# Patient Record
Sex: Male | Born: 1945 | Race: White | Hispanic: No | Smoking: Never smoker
Health system: Southern US, Community
[De-identification: ages and names within clinical notes are randomized; demographics above are authoritative.]

## PROBLEM LIST (undated history)

## (undated) DIAGNOSIS — R0609 Other forms of dyspnea: Secondary | ICD-10-CM

## (undated) DIAGNOSIS — I10 Essential (primary) hypertension: Secondary | ICD-10-CM

## (undated) DIAGNOSIS — I471 Supraventricular tachycardia, unspecified: Secondary | ICD-10-CM

## (undated) DIAGNOSIS — G4700 Insomnia, unspecified: Secondary | ICD-10-CM

## (undated) DIAGNOSIS — Z950 Presence of cardiac pacemaker: Secondary | ICD-10-CM

## (undated) DIAGNOSIS — G8929 Other chronic pain: Secondary | ICD-10-CM

## (undated) DIAGNOSIS — N2889 Other specified disorders of kidney and ureter: Secondary | ICD-10-CM

## (undated) DIAGNOSIS — I619 Nontraumatic intracerebral hemorrhage, unspecified: Secondary | ICD-10-CM

## (undated) DIAGNOSIS — Z8701 Personal history of pneumonia (recurrent): Secondary | ICD-10-CM

## (undated) DIAGNOSIS — M545 Low back pain, unspecified: Secondary | ICD-10-CM

## (undated) DIAGNOSIS — G40909 Epilepsy, unspecified, not intractable, without status epilepticus: Secondary | ICD-10-CM

## (undated) DIAGNOSIS — Z942 Lung transplant status: Secondary | ICD-10-CM

## (undated) DIAGNOSIS — Z9989 Dependence on other enabling machines and devices: Secondary | ICD-10-CM

## (undated) DIAGNOSIS — Z8719 Personal history of other diseases of the digestive system: Secondary | ICD-10-CM

## (undated) DIAGNOSIS — M47816 Spondylosis without myelopathy or radiculopathy, lumbar region: Secondary | ICD-10-CM

## (undated) DIAGNOSIS — K5790 Diverticulosis of intestine, part unspecified, without perforation or abscess without bleeding: Secondary | ICD-10-CM

## (undated) DIAGNOSIS — G4733 Obstructive sleep apnea (adult) (pediatric): Secondary | ICD-10-CM

## (undated) DIAGNOSIS — R296 Repeated falls: Secondary | ICD-10-CM

## (undated) DIAGNOSIS — N182 Chronic kidney disease, stage 2 (mild): Secondary | ICD-10-CM

## (undated) DIAGNOSIS — F329 Major depressive disorder, single episode, unspecified: Secondary | ICD-10-CM

## (undated) DIAGNOSIS — G43909 Migraine, unspecified, not intractable, without status migrainosus: Secondary | ICD-10-CM

## (undated) DIAGNOSIS — D649 Anemia, unspecified: Secondary | ICD-10-CM

## (undated) DIAGNOSIS — I480 Paroxysmal atrial fibrillation: Secondary | ICD-10-CM

## (undated) DIAGNOSIS — E785 Hyperlipidemia, unspecified: Secondary | ICD-10-CM

## (undated) DIAGNOSIS — F32A Depression, unspecified: Secondary | ICD-10-CM

## (undated) DIAGNOSIS — R7301 Impaired fasting glucose: Secondary | ICD-10-CM

## (undated) DIAGNOSIS — K922 Gastrointestinal hemorrhage, unspecified: Secondary | ICD-10-CM

## (undated) DIAGNOSIS — R0600 Dyspnea, unspecified: Secondary | ICD-10-CM

## (undated) DIAGNOSIS — N200 Calculus of kidney: Secondary | ICD-10-CM

## (undated) DIAGNOSIS — I495 Sick sinus syndrome: Secondary | ICD-10-CM

## (undated) HISTORY — DX: Sick sinus syndrome: I49.5

## (undated) HISTORY — DX: Essential (primary) hypertension: I10

## (undated) HISTORY — DX: Paroxysmal atrial fibrillation: I48.0

## (undated) HISTORY — DX: Obstructive sleep apnea (adult) (pediatric): G47.33

## (undated) HISTORY — DX: Lung transplant status: Z94.2

## (undated) HISTORY — DX: Insomnia, unspecified: G47.00

## (undated) HISTORY — DX: Dyspnea, unspecified: R06.00

## (undated) HISTORY — DX: Presence of cardiac pacemaker: Z95.0

## (undated) HISTORY — DX: Repeated falls: R29.6

## (undated) HISTORY — DX: Major depressive disorder, single episode, unspecified: F32.9

## (undated) HISTORY — PX: APPENDECTOMY: SHX54

## (undated) HISTORY — DX: Other forms of dyspnea: R06.09

## (undated) HISTORY — DX: Nontraumatic intracerebral hemorrhage, unspecified: I61.9

## (undated) HISTORY — DX: Hyperlipidemia, unspecified: E78.5

## (undated) HISTORY — DX: Low back pain, unspecified: M54.50

## (undated) HISTORY — DX: Other specified disorders of kidney and ureter: N28.89

## (undated) HISTORY — DX: Impaired fasting glucose: R73.01

## (undated) HISTORY — DX: Calculus of kidney: N20.0

## (undated) HISTORY — DX: Migraine, unspecified, not intractable, without status migrainosus: G43.909

## (undated) HISTORY — DX: Epilepsy, unspecified, not intractable, without status epilepticus: G40.909

## (undated) HISTORY — DX: Gastrointestinal hemorrhage, unspecified: K92.2

## (undated) HISTORY — DX: Low back pain: M54.5

## (undated) HISTORY — DX: Other chronic pain: G89.29

## (undated) HISTORY — DX: Dependence on other enabling machines and devices: Z99.89

## (undated) HISTORY — DX: Depression, unspecified: F32.A

## (undated) HISTORY — DX: Personal history of other diseases of the digestive system: Z87.19

## (undated) HISTORY — DX: Anemia, unspecified: D64.9

## (undated) HISTORY — DX: Personal history of pneumonia (recurrent): Z87.01

## (undated) HISTORY — DX: Spondylosis without myelopathy or radiculopathy, lumbar region: M47.816

## (undated) HISTORY — DX: Diverticulosis of intestine, part unspecified, without perforation or abscess without bleeding: K57.90

## (undated) HISTORY — DX: Chronic kidney disease, stage 2 (mild): N18.2

---

## 1997-07-03 ENCOUNTER — Emergency Department (HOSPITAL_COMMUNITY): Admission: EM | Admit: 1997-07-03 | Discharge: 1997-07-03 | Payer: Self-pay | Admitting: Emergency Medicine

## 2000-01-09 HISTORY — PX: SPLENECTOMY, TOTAL: SHX788

## 2010-01-08 HISTORY — PX: LUNG TRANSPLANT, DOUBLE: SHX704

## 2013-08-22 ENCOUNTER — Encounter (HOSPITAL_COMMUNITY): Payer: Self-pay | Admitting: Emergency Medicine

## 2013-08-22 ENCOUNTER — Emergency Department (HOSPITAL_COMMUNITY)
Admission: EM | Admit: 2013-08-22 | Discharge: 2013-08-22 | Disposition: A | Discharge status: Home / Self Care | Payer: Medicare Other | Attending: Emergency Medicine | Admitting: Emergency Medicine

## 2013-08-22 ENCOUNTER — Emergency Department (HOSPITAL_COMMUNITY): Payer: Medicare Other

## 2013-08-22 DIAGNOSIS — Z79899 Other long term (current) drug therapy: Secondary | ICD-10-CM | POA: Diagnosis not present

## 2013-08-22 DIAGNOSIS — IMO0002 Reserved for concepts with insufficient information to code with codable children: Secondary | ICD-10-CM | POA: Diagnosis not present

## 2013-08-22 DIAGNOSIS — Z7982 Long term (current) use of aspirin: Secondary | ICD-10-CM | POA: Diagnosis not present

## 2013-08-22 DIAGNOSIS — I498 Other specified cardiac arrhythmias: Secondary | ICD-10-CM | POA: Diagnosis not present

## 2013-08-22 DIAGNOSIS — R000 Tachycardia, unspecified: Secondary | ICD-10-CM | POA: Diagnosis present

## 2013-08-22 DIAGNOSIS — D649 Anemia, unspecified: Secondary | ICD-10-CM | POA: Diagnosis not present

## 2013-08-22 DIAGNOSIS — I471 Supraventricular tachycardia: Secondary | ICD-10-CM

## 2013-08-22 HISTORY — DX: Supraventricular tachycardia, unspecified: I47.10

## 2013-08-22 HISTORY — DX: Supraventricular tachycardia: I47.1

## 2013-08-22 LAB — COMPREHENSIVE METABOLIC PANEL
ALBUMIN: 3.1 g/dL — AB (ref 3.5–5.2)
ALT: 22 U/L (ref 0–53)
ANION GAP: 13 (ref 5–15)
AST: 19 U/L (ref 0–37)
Alkaline Phosphatase: 102 U/L (ref 39–117)
BUN: 33 mg/dL — ABNORMAL HIGH (ref 6–23)
CHLORIDE: 103 meq/L (ref 96–112)
CO2: 25 mEq/L (ref 19–32)
Calcium: 8.9 mg/dL (ref 8.4–10.5)
Creatinine, Ser: 1.22 mg/dL (ref 0.50–1.35)
GFR calc Af Amer: 69 mL/min — ABNORMAL LOW (ref >90)
GFR calc non Af Amer: 60 mL/min — ABNORMAL LOW (ref >90)
Glucose, Bld: 158 mg/dL — ABNORMAL HIGH (ref 70–99)
POTASSIUM: 4.9 meq/L (ref 3.7–5.3)
SODIUM: 141 meq/L (ref 137–147)
TOTAL PROTEIN: 6.3 g/dL (ref 6.0–8.3)

## 2013-08-22 LAB — CBC WITH DIFFERENTIAL/PLATELET
BASOS ABS: 0 10*3/uL (ref 0.0–0.1)
Basophils Relative: 0 % (ref 0–1)
Eosinophils Absolute: 0.2 10*3/uL (ref 0.0–0.7)
Eosinophils Relative: 3 % (ref 0–5)
HCT: 36.2 % — ABNORMAL LOW (ref 39.0–52.0)
Hemoglobin: 12.2 g/dL — ABNORMAL LOW (ref 13.0–17.0)
LYMPHS ABS: 1.3 10*3/uL (ref 0.7–4.0)
LYMPHS PCT: 19 % (ref 12–46)
MCH: 31 pg (ref 26.0–34.0)
MCHC: 33.7 g/dL (ref 30.0–36.0)
MCV: 91.9 fL (ref 78.0–100.0)
Monocytes Absolute: 1.1 10*3/uL — ABNORMAL HIGH (ref 0.1–1.0)
Monocytes Relative: 16 % — ABNORMAL HIGH (ref 3–12)
NEUTROS ABS: 4.4 10*3/uL (ref 1.7–7.7)
NEUTROS PCT: 62 % (ref 43–77)
PLATELETS: 202 10*3/uL (ref 150–400)
RBC: 3.94 MIL/uL — ABNORMAL LOW (ref 4.22–5.81)
RDW: 13.2 % (ref 11.5–15.5)
WBC: 7.1 10*3/uL (ref 4.0–10.5)

## 2013-08-22 LAB — I-STAT TROPONIN, ED: TROPONIN I, POC: 0 ng/mL (ref 0.00–0.08)
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
## 2013-08-22 NOTE — ED Notes (Signed)
Per EMS, pt was unloading dishes from a dishwasher when he began having feeling dizzy and became clammy and pale. Pt had initial rhythm that showed SVT with a rate of 180. EMS attempted vagal maneuvers with no success and gave 6 of adenosine which converted the pt in sinus tach. Pt reports nausea at this time.  BP; 130/90 HR: 106

## 2014-03-09 DIAGNOSIS — I619 Nontraumatic intracerebral hemorrhage, unspecified: Secondary | ICD-10-CM

## 2014-03-09 HISTORY — DX: Nontraumatic intracerebral hemorrhage, unspecified: I61.9

## 2014-09-27 HISTORY — PX: COLONOSCOPY: SHX174

## 2014-11-06 LAB — LIPID PANEL
Cholesterol: 171 (ref 0–200)
HDL: 59 (ref 35–70)
LDL Cholesterol: 93
Triglycerides: 97 (ref 40–160)

## 2014-11-06 LAB — HEMOGLOBIN A1C: HEMOGLOBIN A1C: 5.8

## 2015-03-30 IMAGING — CR DG CHEST 2V
2 series · 2 of 2 positions shown · non-contrast
Comparison: None.

CLINICAL DATA: Dizziness, tachycardia.

EXAM:
CHEST  2 VIEW

[w chest pa]
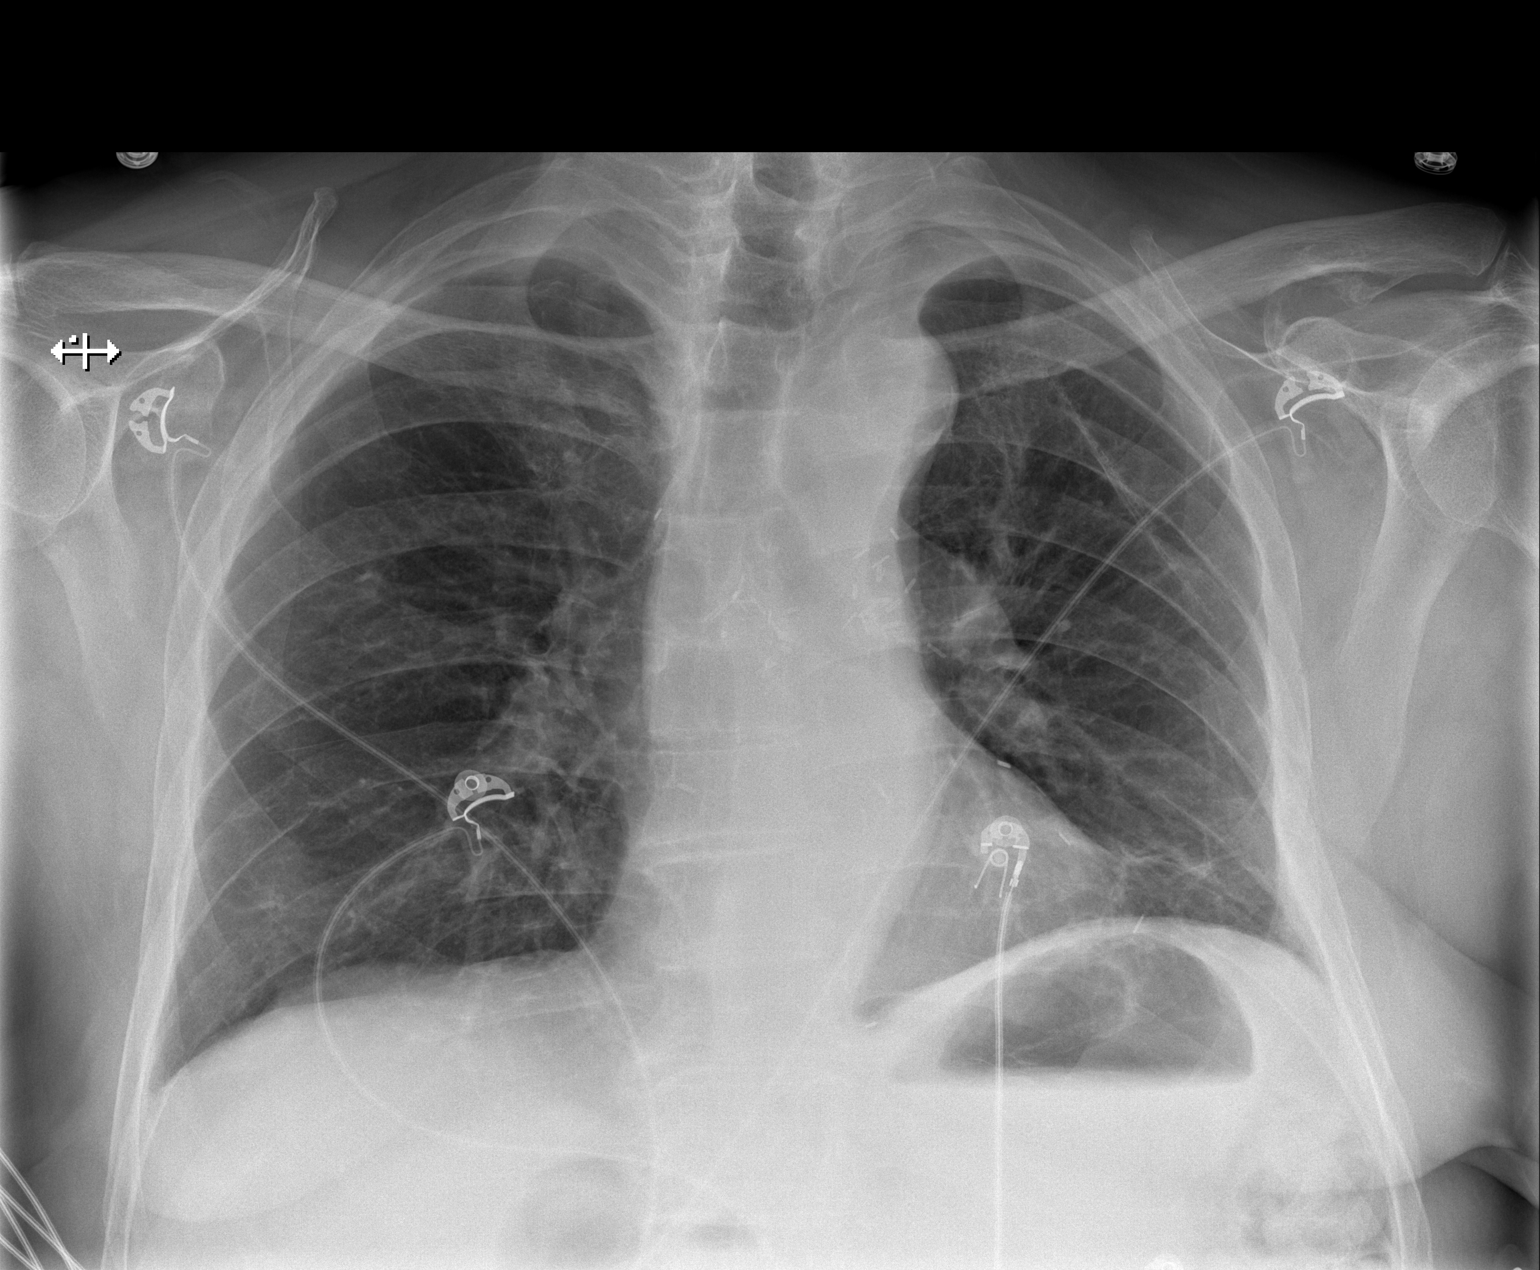

[w chest lat]
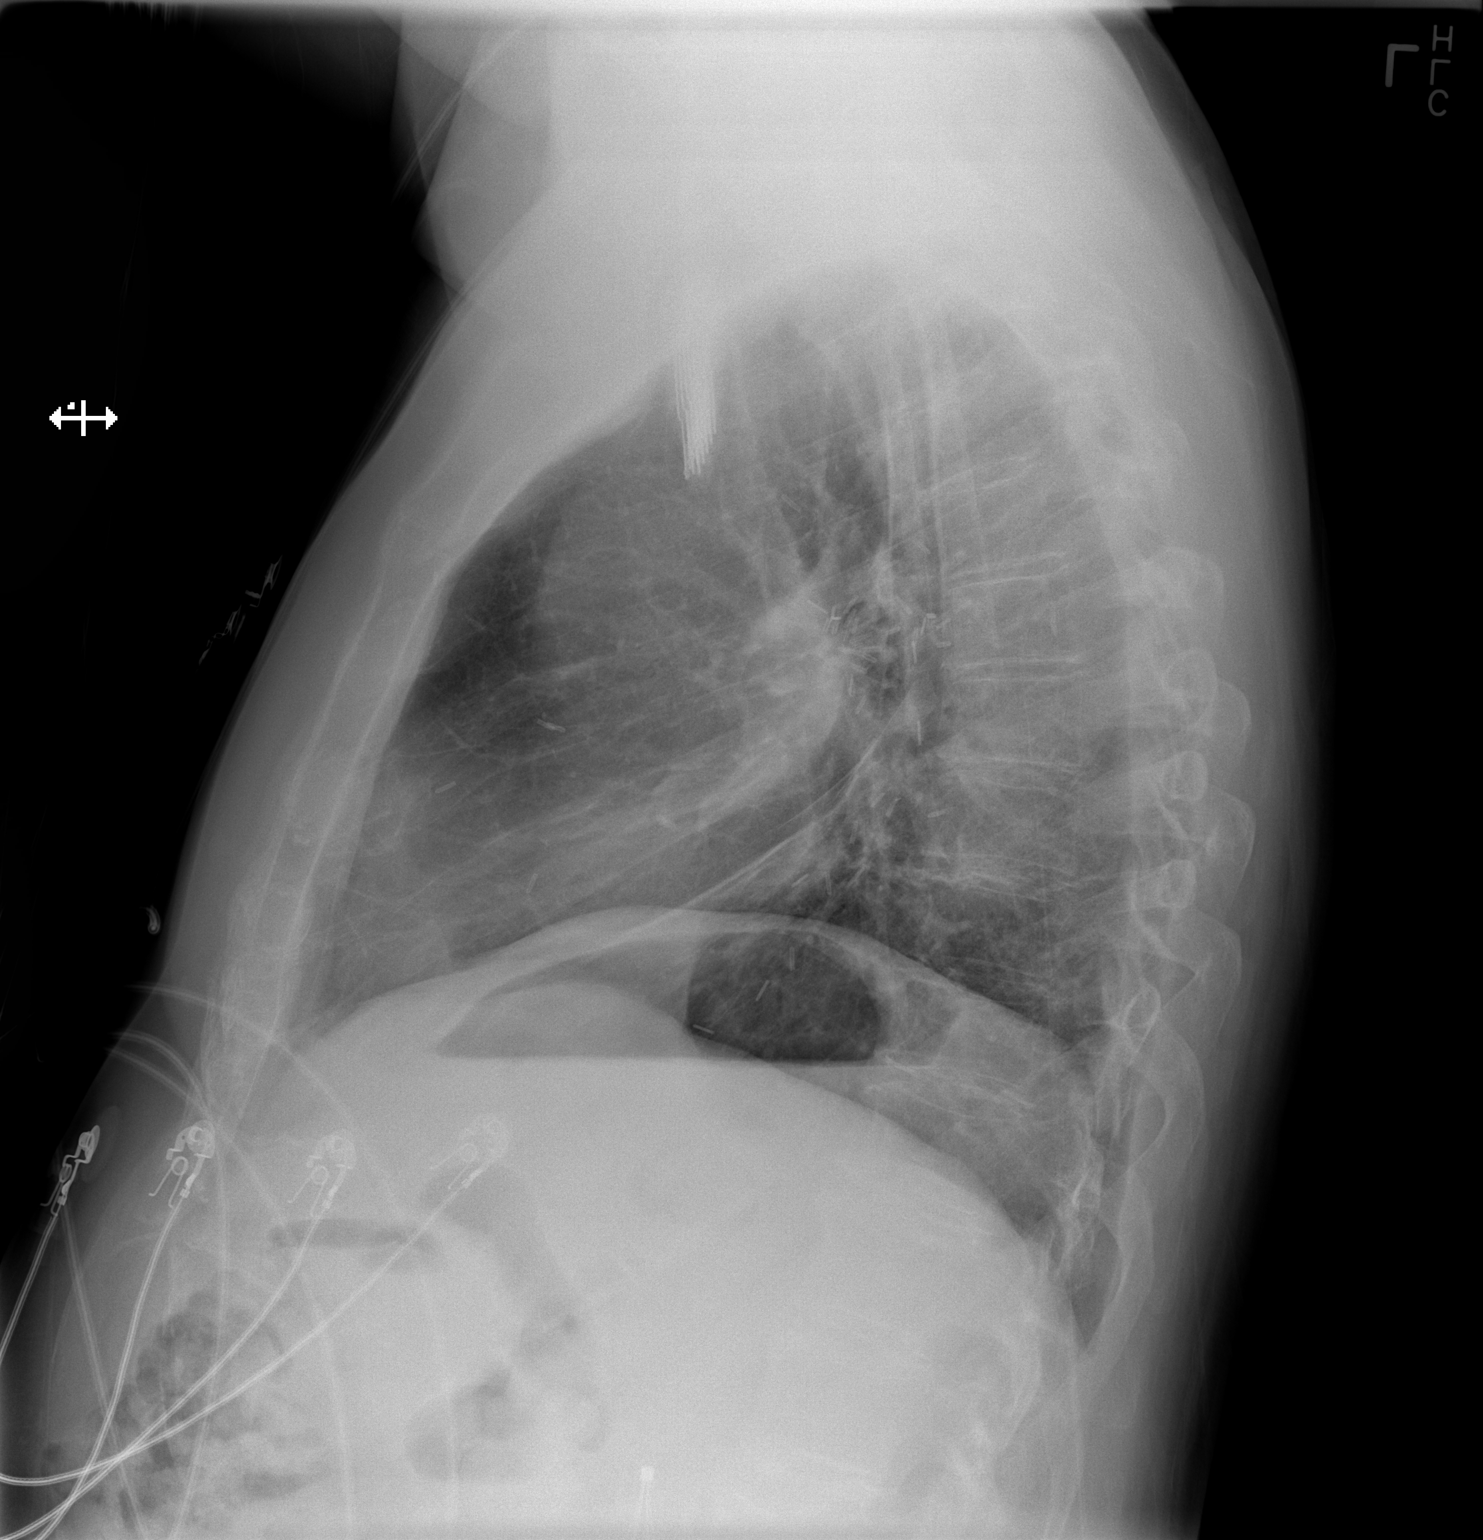

[2 of 2 positions shown; findings below may reference images not displayed]

FINDINGS: Cardiac silhouette appears normal, mediastinal silhouette is
nonsuspicious, mildly calcified aortic knob. Surgical clips within
the anterior and middle mediastinum. Scarring at left lung base. No
pleural effusions or focal consolidations. No pneumothorax.

Partially imaged inferior vena cava filter at the level of L2-3.
Soft tissue planes and included osseous structures are
nonsuspicious. Mild degenerative change of the thoracic spine.
Possible left thoracotomy.
IMPRESSION: No acute cardiopulmonary process.

Postsurgical changes within the mediastinum with scarring left lung
base.

  By: Reisha Aduju

## 2015-06-24 HISTORY — PX: PACEMAKER PLACEMENT: SHX43

## 2015-06-24 LAB — POCT INR: INR: 1.2 — AB (ref <=1.1)

## 2015-09-02 HISTORY — PX: TRANSTHORACIC ECHOCARDIOGRAM: SHX275

## 2016-05-11 LAB — HEPATIC FUNCTION PANEL
ALT: 28 (ref 10–40)
AST: 33 (ref 14–40)

## 2016-05-11 LAB — BASIC METABOLIC PANEL
BUN: 30 — AB (ref 4–21)
Creatinine: 1.2 (ref <=1.3)
GLUCOSE: 108
Potassium: 4.7 (ref 3.4–5.3)
SODIUM: 139 (ref 137–147)

## 2016-05-11 LAB — CBC AND DIFFERENTIAL
HCT: 37 — AB (ref 41–53)
Hemoglobin: 12.4 — AB (ref 13.5–17.5)
Platelets: 239 (ref 150–399)
WBC: 4.5

## 2016-07-27 ENCOUNTER — Encounter: Payer: Self-pay | Admitting: *Deleted

## 2016-07-27 ENCOUNTER — Telehealth: Payer: Self-pay | Admitting: *Deleted

## 2016-07-27 NOTE — Telephone Encounter (Signed)
Pt has a New Pt apt on 08/22/16.  Received complete medical record from [facility].  I have reviewed and abstracted these records.   Records have been put on Dr. Verdis FredericksonMcGowens desk for review.

## 2016-08-08 DIAGNOSIS — D649 Anemia, unspecified: Secondary | ICD-10-CM

## 2016-08-08 HISTORY — DX: Anemia, unspecified: D64.9

## 2016-08-19 ENCOUNTER — Encounter: Payer: Self-pay | Admitting: Family Medicine

## 2016-08-22 ENCOUNTER — Ambulatory Visit (INDEPENDENT_AMBULATORY_CARE_PROVIDER_SITE_OTHER): Payer: Medicare Other | Admitting: Family Medicine

## 2016-08-22 ENCOUNTER — Encounter: Payer: Self-pay | Admitting: Family Medicine

## 2016-08-22 VITALS — BP 132/77 | HR 70 | Temp 98.1°F | Resp 16 | Ht 71.0 in | Wt 223.0 lb

## 2016-08-22 DIAGNOSIS — Z79899 Other long term (current) drug therapy: Secondary | ICD-10-CM

## 2016-08-22 DIAGNOSIS — I480 Paroxysmal atrial fibrillation: Secondary | ICD-10-CM

## 2016-08-22 DIAGNOSIS — M545 Low back pain: Secondary | ICD-10-CM

## 2016-08-22 DIAGNOSIS — G8929 Other chronic pain: Secondary | ICD-10-CM | POA: Diagnosis not present

## 2016-08-22 DIAGNOSIS — N183 Chronic kidney disease, stage 3 unspecified: Secondary | ICD-10-CM

## 2016-08-22 DIAGNOSIS — M5136 Other intervertebral disc degeneration, lumbar region: Secondary | ICD-10-CM | POA: Diagnosis not present

## 2016-08-22 DIAGNOSIS — Z942 Lung transplant status: Secondary | ICD-10-CM | POA: Diagnosis not present

## 2016-08-22 DIAGNOSIS — G894 Chronic pain syndrome: Secondary | ICD-10-CM | POA: Diagnosis not present

## 2016-08-22 DIAGNOSIS — I495 Sick sinus syndrome: Secondary | ICD-10-CM | POA: Diagnosis not present

## 2016-08-22 DIAGNOSIS — Z8673 Personal history of transient ischemic attack (TIA), and cerebral infarction without residual deficits: Secondary | ICD-10-CM

## 2016-08-22 DIAGNOSIS — I10 Essential (primary) hypertension: Secondary | ICD-10-CM

## 2016-08-22 MED ORDER — OXYCODONE-ACETAMINOPHEN 7.5-325 MG PO TABS: 1.0000 | ORAL_TABLET | ORAL | 0 refills | Status: DC | PRN
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
## 2016-08-24 ENCOUNTER — Other Ambulatory Visit (INDEPENDENT_AMBULATORY_CARE_PROVIDER_SITE_OTHER): Payer: Medicare Other

## 2016-08-24 DIAGNOSIS — Z942 Lung transplant status: Secondary | ICD-10-CM

## 2016-08-24 DIAGNOSIS — Z79899 Other long term (current) drug therapy: Secondary | ICD-10-CM

## 2016-08-24 DIAGNOSIS — N183 Chronic kidney disease, stage 3 unspecified: Secondary | ICD-10-CM

## 2016-08-25 LAB — TACROLIMUS LEVEL: TACROLIMUS LVL: 9.1 ng/mL (ref 5.0–20.0)

## 2016-08-28 NOTE — Addendum Note (Signed)
Addended by: Eulah Pont on: 08/28/2016 10:07 AM   Modules accepted: Orders

## 2016-08-29 ENCOUNTER — Other Ambulatory Visit (INDEPENDENT_AMBULATORY_CARE_PROVIDER_SITE_OTHER): Payer: Medicare Other

## 2016-08-29 DIAGNOSIS — N183 Chronic kidney disease, stage 3 unspecified: Secondary | ICD-10-CM

## 2016-08-29 DIAGNOSIS — Z942 Lung transplant status: Secondary | ICD-10-CM

## 2016-08-29 DIAGNOSIS — Z79899 Other long term (current) drug therapy: Secondary | ICD-10-CM | POA: Diagnosis not present

## 2016-08-29 LAB — CBC WITH DIFFERENTIAL/PLATELET
BASOS ABS: 0.1 10*3/uL (ref 0.0–0.1)
Basophils Relative: 1.7 % (ref 0.0–3.0)
Eosinophils Absolute: 0.1 10*3/uL (ref 0.0–0.7)
Eosinophils Relative: 2.4 % (ref 0.0–5.0)
HCT: 35 % — ABNORMAL LOW (ref 39.0–52.0)
Hemoglobin: 11.5 g/dL — ABNORMAL LOW (ref 13.0–17.0)
LYMPHS ABS: 1.3 10*3/uL (ref 0.7–4.0)
Lymphocytes Relative: 28.7 % (ref 12.0–46.0)
MCHC: 32.8 g/dL (ref 30.0–36.0)
MCV: 95.1 fl (ref 78.0–100.0)
MONOS PCT: 12.7 % — AB (ref 3.0–12.0)
Monocytes Absolute: 0.6 10*3/uL (ref 0.1–1.0)
NEUTROS ABS: 2.5 10*3/uL (ref 1.4–7.7)
NEUTROS PCT: 54.5 % (ref 43.0–77.0)
PLATELETS: 211 10*3/uL (ref 150.0–400.0)
RBC: 3.67 Mil/uL — ABNORMAL LOW (ref 4.22–5.81)
RDW: 16.2 % — ABNORMAL HIGH (ref 11.5–15.5)
WBC: 4.5 10*3/uL (ref 4.0–10.5)

## 2016-08-29 LAB — COMPREHENSIVE METABOLIC PANEL
ALT: 12 U/L (ref 0–53)
AST: 16 U/L (ref 0–37)
Albumin: 3.8 g/dL (ref 3.5–5.2)
Alkaline Phosphatase: 91 U/L (ref 39–117)
BILIRUBIN TOTAL: 0.3 mg/dL (ref 0.2–1.2)
BUN: 29 mg/dL — ABNORMAL HIGH (ref 6–23)
CO2: 30 meq/L (ref 19–32)
Calcium: 8.6 mg/dL (ref 8.4–10.5)
Chloride: 104 mEq/L (ref 96–112)
Creatinine, Ser: 1.33 mg/dL (ref 0.40–1.50)
GFR: 56.38 mL/min — AB (ref >60.00)
GLUCOSE: 145 mg/dL — AB (ref 70–99)
POTASSIUM: 4.7 meq/L (ref 3.5–5.1)
Sodium: 139 mEq/L (ref 135–145)
Total Protein: 6.1 g/dL (ref 6.0–8.3)

## 2016-08-29 NOTE — Progress Notes (Signed)
Patient came into office today for repeat lab work, due to lab error last time.  NO charge should be placed for venipuncture and collection.

## 2016-08-30 ENCOUNTER — Encounter: Payer: Self-pay | Admitting: Family Medicine

## 2016-08-30 ENCOUNTER — Other Ambulatory Visit (INDEPENDENT_AMBULATORY_CARE_PROVIDER_SITE_OTHER): Payer: Medicare Other

## 2016-08-30 DIAGNOSIS — D500 Iron deficiency anemia secondary to blood loss (chronic): Secondary | ICD-10-CM

## 2016-08-30 DIAGNOSIS — R739 Hyperglycemia, unspecified: Secondary | ICD-10-CM | POA: Diagnosis not present

## 2016-08-30 LAB — IBC PANEL
IRON: 117 ug/dL (ref 42–165)
SATURATION RATIOS: 34 % (ref 20.0–50.0)
Transferrin: 246 mg/dL (ref 212.0–360.0)

## 2016-08-30 LAB — FERRITIN: Ferritin: 41.9 ng/mL (ref 22.0–322.0)

## 2016-08-30 LAB — VITAMIN B12: VITAMIN B 12: 580 pg/mL (ref 211–911)

## 2016-08-30 LAB — HEMOGLOBIN A1C: Hgb A1c MFr Bld: 5.9 % (ref 4.6–6.5)

## 2016-09-06 ENCOUNTER — Other Ambulatory Visit: Payer: Medicare Other

## 2016-09-06 ENCOUNTER — Encounter: Payer: Self-pay | Admitting: Family Medicine

## 2016-09-06 ENCOUNTER — Other Ambulatory Visit: Payer: Self-pay | Admitting: Family Medicine

## 2016-09-06 DIAGNOSIS — D508 Other iron deficiency anemias: Secondary | ICD-10-CM

## 2016-09-06 LAB — HEMOCCULT SLIDES (X 3 CARDS)
FECAL OCCULT BLD: NEGATIVE
OCCULT 1: NEGATIVE
OCCULT 2: NEGATIVE
OCCULT 3: NEGATIVE
OCCULT 4: NEGATIVE
OCCULT 5: NEGATIVE

## 2016-09-19 ENCOUNTER — Encounter: Payer: Self-pay | Admitting: Family Medicine

## 2016-09-19 ENCOUNTER — Ambulatory Visit (INDEPENDENT_AMBULATORY_CARE_PROVIDER_SITE_OTHER): Payer: Medicare Other | Admitting: Family Medicine

## 2016-09-19 VITALS — BP 105/66 | HR 65 | Temp 97.9°F | Resp 16 | Ht 71.0 in | Wt 220.8 lb

## 2016-09-19 DIAGNOSIS — N183 Chronic kidney disease, stage 3 unspecified: Secondary | ICD-10-CM

## 2016-09-19 DIAGNOSIS — I10 Essential (primary) hypertension: Secondary | ICD-10-CM | POA: Diagnosis not present

## 2016-09-19 DIAGNOSIS — G894 Chronic pain syndrome: Secondary | ICD-10-CM

## 2016-09-19 DIAGNOSIS — Z23 Encounter for immunization: Secondary | ICD-10-CM

## 2016-09-19 DIAGNOSIS — Z8709 Personal history of other diseases of the respiratory system: Secondary | ICD-10-CM | POA: Diagnosis not present

## 2016-09-19 DIAGNOSIS — Z942 Lung transplant status: Secondary | ICD-10-CM | POA: Diagnosis not present

## 2016-09-19 MED ORDER — CLONAZEPAM 0.5 MG PO TABS: 0.5000 mg | ORAL_TABLET | Freq: Two times a day (BID) | ORAL | 4 refills | Status: AC

## 2016-09-19 MED ORDER — OXYCODONE-ACETAMINOPHEN 7.5-325 MG PO TABS: ORAL_TABLET | ORAL | 0 refills | Status: AC
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
## 2016-09-24 ENCOUNTER — Other Ambulatory Visit: Payer: Self-pay | Admitting: *Deleted

## 2016-09-24 MED ORDER — FERROUS SULFATE 325 (65 FE) MG PO TABS: 325.0000 mg | ORAL_TABLET | Freq: Every day | ORAL | 3 refills | Status: AC

## 2016-09-24 MED ORDER — METOPROLOL SUCCINATE ER 25 MG PO TB24: 12.5000 mg | ORAL_TABLET | Freq: Every day | ORAL | 3 refills | Status: DC

## 2016-09-24 NOTE — Telephone Encounter (Signed)
New pt. Please advise. Thanks.  

## 2016-09-24 NOTE — Telephone Encounter (Signed)
Brenda advised

## 2016-09-25 ENCOUNTER — Telehealth: Payer: Self-pay | Admitting: Family Medicine

## 2016-09-25 MED ORDER — GABAPENTIN 400 MG PO CAPS: 800.0000 mg | ORAL_CAPSULE | Freq: Two times a day (BID) | ORAL | 1 refills | Status: DC

## 2016-09-25 NOTE — Telephone Encounter (Signed)
Pt notified refill sent to pharmacy as requested.

## 2016-09-25 NOTE — Telephone Encounter (Signed)
Gabapentin RF'd. 

## 2016-09-25 NOTE — Telephone Encounter (Signed)
Requesting refill of gabapentin (NEURONTIN) 400 MG capsule.  Pharmacy:  [id number] - [place], [place] - [phone] (Phone) [phone] (Fax)

## 2016-09-26 ENCOUNTER — Encounter: Payer: Self-pay | Admitting: Family Medicine

## 2016-10-12 ENCOUNTER — Institutional Professional Consult (permissible substitution): Payer: Medicare Other | Admitting: Pulmonary Disease

## 2016-10-17 ENCOUNTER — Other Ambulatory Visit: Payer: Self-pay | Admitting: *Deleted

## 2016-10-17 NOTE — Telephone Encounter (Signed)
Fax from pharmacy requesting 90 day supply, qty: 360. Please advise. Thanks.

## 2016-10-18 MED ORDER — GABAPENTIN 400 MG PO CAPS: 800.0000 mg | ORAL_CAPSULE | Freq: Two times a day (BID) | ORAL | 0 refills | Status: AC

## 2016-10-22 ENCOUNTER — Other Ambulatory Visit: Payer: Self-pay | Admitting: *Deleted

## 2016-10-22 ENCOUNTER — Ambulatory Visit: Payer: Medicare Other | Admitting: Family Medicine

## 2016-10-22 MED ORDER — ZOLPIDEM TARTRATE ER 6.25 MG PO TBCR: 6.2500 mg | EXTENDED_RELEASE_TABLET | Freq: Every day | ORAL | 2 refills | Status: AC

## 2016-10-22 NOTE — Telephone Encounter (Signed)
Note from pharmacy saying that Rx was written at [facility] by DR who's DEA number is no longer active. Would you be willing to write new Rs for this? Please advise. Thanks.    SW Steward Drone to confirm medication dose since pharmacy had pt taking 6.25mg  and our chart showed 12.5mg . She stated that pt is taking the 6.25mg . I have updated med list.

## 2016-10-22 NOTE — Telephone Encounter (Signed)
I will authorize the ambien CR 6.25mg  qhs.

## 2016-10-23 NOTE — Telephone Encounter (Signed)
Rx faxed

## 2016-12-19 ENCOUNTER — Other Ambulatory Visit: Payer: Self-pay | Admitting: *Deleted

## 2016-12-19 MED ORDER — METOPROLOL SUCCINATE ER 25 MG PO TB24: 12.5000 mg | ORAL_TABLET | Freq: Every day | ORAL | 0 refills | Status: AC

## 2017-01-07 ENCOUNTER — Other Ambulatory Visit: Payer: Self-pay | Admitting: *Deleted

## 2017-01-07 NOTE — Telephone Encounter (Signed)
Opened in error

## 2017-01-10 ENCOUNTER — Other Ambulatory Visit: Payer: Self-pay | Admitting: Family Medicine

## 2017-11-08 DEATH — deceased
